# Patient Record
Sex: Female | Born: 1955 | Race: Black or African American | Hispanic: No | Marital: Married | Smoking: Never smoker
Health system: Southern US, Community
[De-identification: ages and names within clinical notes are randomized; demographics above are authoritative.]

---

## 2018-02-24 ENCOUNTER — Observation Stay (HOSPITAL_BASED_OUTPATIENT_CLINIC_OR_DEPARTMENT_OTHER)
Admission: EM | Admit: 2018-02-24 | Discharge: 2018-02-25 | Disposition: A | Discharge status: Home / Self Care | Payer: Federal, State, Local not specified - PPO | Attending: Internal Medicine | Admitting: Internal Medicine

## 2018-02-24 ENCOUNTER — Emergency Department (HOSPITAL_BASED_OUTPATIENT_CLINIC_OR_DEPARTMENT_OTHER): Payer: Federal, State, Local not specified - PPO

## 2018-02-24 ENCOUNTER — Other Ambulatory Visit: Payer: Self-pay

## 2018-02-24 ENCOUNTER — Encounter (HOSPITAL_BASED_OUTPATIENT_CLINIC_OR_DEPARTMENT_OTHER): Payer: Self-pay | Admitting: Emergency Medicine

## 2018-02-24 DIAGNOSIS — R55 Syncope and collapse: Secondary | ICD-10-CM | POA: Diagnosis not present

## 2018-02-24 DIAGNOSIS — Y92002 Bathroom of unspecified non-institutional (private) residence single-family (private) house as the place of occurrence of the external cause: Secondary | ICD-10-CM | POA: Diagnosis not present

## 2018-02-24 DIAGNOSIS — D869 Sarcoidosis, unspecified: Secondary | ICD-10-CM | POA: Diagnosis not present

## 2018-02-24 DIAGNOSIS — Z7952 Long term (current) use of systemic steroids: Secondary | ICD-10-CM | POA: Diagnosis not present

## 2018-02-24 DIAGNOSIS — W1830XA Fall on same level, unspecified, initial encounter: Secondary | ICD-10-CM | POA: Insufficient documentation

## 2018-02-24 DIAGNOSIS — Z885 Allergy status to narcotic agent status: Secondary | ICD-10-CM | POA: Diagnosis not present

## 2018-02-24 DIAGNOSIS — R42 Dizziness and giddiness: Secondary | ICD-10-CM | POA: Diagnosis not present

## 2018-02-24 DIAGNOSIS — I951 Orthostatic hypotension: Secondary | ICD-10-CM | POA: Diagnosis not present

## 2018-02-24 DIAGNOSIS — S2231XA Fracture of one rib, right side, initial encounter for closed fracture: Secondary | ICD-10-CM | POA: Insufficient documentation

## 2018-02-24 DIAGNOSIS — Z79899 Other long term (current) drug therapy: Secondary | ICD-10-CM | POA: Insufficient documentation

## 2018-02-24 DIAGNOSIS — I700 Atherosclerosis of aorta: Secondary | ICD-10-CM | POA: Insufficient documentation

## 2018-02-24 LAB — URINALYSIS, ROUTINE W REFLEX MICROSCOPIC
Bilirubin Urine: NEGATIVE
Glucose, UA: NEGATIVE mg/dL
HGB URINE DIPSTICK: NEGATIVE
Ketones, ur: 15 mg/dL — AB
Nitrite: NEGATIVE
PH: 6 (ref 5.0–8.0)
Protein, ur: NEGATIVE mg/dL
SPECIFIC GRAVITY, URINE: 1.025 (ref 1.005–1.030)

## 2018-02-24 LAB — CBC WITH DIFFERENTIAL/PLATELET
BASOS ABS: 0 10*3/uL (ref 0.0–0.1)
Basophils Relative: 0 %
EOS ABS: 0.2 10*3/uL (ref 0.0–0.7)
Eosinophils Relative: 2 %
HCT: 38.7 % (ref 36.0–46.0)
HEMOGLOBIN: 12.7 g/dL (ref 12.0–15.0)
LYMPHS ABS: 1.7 10*3/uL (ref 0.7–4.0)
Lymphocytes Relative: 20 %
MCH: 30.1 pg (ref 26.0–34.0)
MCHC: 32.8 g/dL (ref 30.0–36.0)
MCV: 91.7 fL (ref 78.0–100.0)
Monocytes Absolute: 1.2 10*3/uL — ABNORMAL HIGH (ref 0.1–1.0)
Monocytes Relative: 14 %
NEUTROS PCT: 64 %
Neutro Abs: 5.4 10*3/uL (ref 1.7–7.7)
Platelets: 140 10*3/uL — ABNORMAL LOW (ref 150–400)
RBC: 4.22 MIL/uL (ref 3.87–5.11)
RDW: 13.3 % (ref 11.5–15.5)
WBC: 8.5 10*3/uL (ref 4.0–10.5)

## 2018-02-24 LAB — COMPREHENSIVE METABOLIC PANEL
ALT: 24 U/L (ref 14–54)
AST: 30 U/L (ref 15–41)
Albumin: 4.3 g/dL (ref 3.5–5.0)
Alkaline Phosphatase: 87 U/L (ref 38–126)
Anion gap: 8 (ref 5–15)
BUN: 16 mg/dL (ref 6–20)
CALCIUM: 9.3 mg/dL (ref 8.9–10.3)
CO2: 25 mmol/L (ref 22–32)
CREATININE: 1.02 mg/dL — AB (ref 0.44–1.00)
Chloride: 108 mmol/L (ref 101–111)
GFR calc non Af Amer: 58 mL/min — ABNORMAL LOW (ref >60)
Glucose, Bld: 90 mg/dL (ref 65–99)
Potassium: 4.4 mmol/L (ref 3.5–5.1)
SODIUM: 141 mmol/L (ref 135–145)
Total Bilirubin: 0.8 mg/dL (ref 0.3–1.2)
Total Protein: 7.3 g/dL (ref 6.5–8.1)

## 2018-02-24 LAB — URINALYSIS, MICROSCOPIC (REFLEX)

## 2018-02-24 LAB — LIPASE, BLOOD: Lipase: 34 U/L (ref 11–51)

## 2018-02-24 LAB — TROPONIN I
Troponin I: <0.03 ng/mL (ref <0.03)
Troponin I: <0.03 ng/mL (ref <0.03)

## 2018-02-24 MED ORDER — IOPAMIDOL (ISOVUE-370) INJECTION 76%
100.0000 mL | Freq: Once | INTRAVENOUS | Status: AC | PRN
  Administered 2018-02-24: 100 mL via INTRAVENOUS

## 2018-02-24 MED ORDER — SODIUM CHLORIDE 0.9 % IV SOLN
INTRAVENOUS | Status: AC
  Administered 2018-02-24: 22:00:00 via INTRAVENOUS

## 2018-02-24 MED ORDER — ONDANSETRON HCL 4 MG/2ML IJ SOLN
4.0000 mg | Freq: Once | INTRAMUSCULAR | Status: AC
  Administered 2018-02-24: 4 mg via INTRAVENOUS
  Filled 2018-02-24: qty 2

## 2018-02-24 MED ORDER — HYDROCODONE-ACETAMINOPHEN 5-325 MG PO TABS
1.0000 | ORAL_TABLET | Freq: Once | ORAL | Status: AC
  Administered 2018-02-24: 1 via ORAL
  Filled 2018-02-24: qty 1

## 2018-02-24 MED ORDER — PREDNISONE 1 MG PO TABS
3.0000 mg | ORAL_TABLET | Freq: Every day | ORAL | Status: DC
Start: 2018-02-25 — End: 2018-02-25
  Filled 2018-02-24: qty 3

## 2018-02-24 MED ORDER — HYDROCODONE-ACETAMINOPHEN 5-325 MG PO TABS
1.0000 | ORAL_TABLET | ORAL | Status: DC | PRN
  Administered 2018-02-24 – 2018-02-25 (×2): 1 via ORAL
  Filled 2018-02-24 (×3): qty 1

## 2018-02-24 MED ORDER — POLYETHYLENE GLYCOL 3350 17 G PO PACK: 17.0000 g | PACK | Freq: Every day | ORAL | Status: DC | PRN

## 2018-02-24 MED ORDER — ONDANSETRON HCL 4 MG PO TABS: 4.0000 mg | ORAL_TABLET | Freq: Four times a day (QID) | ORAL | Status: DC | PRN

## 2018-02-24 MED ORDER — ENOXAPARIN SODIUM 40 MG/0.4ML ~~LOC~~ SOLN
40.0000 mg | SUBCUTANEOUS | Status: DC
  Administered 2018-02-24: 40 mg via SUBCUTANEOUS
  Filled 2018-02-24: qty 0.4

## 2018-02-24 MED ORDER — ONDANSETRON HCL 4 MG/2ML IJ SOLN: 4.0000 mg | Freq: Four times a day (QID) | INTRAMUSCULAR | Status: DC | PRN

## 2018-02-24 MED ORDER — SODIUM CHLORIDE 0.9 % IV BOLUS (SEPSIS)
1000.0000 mL | Freq: Once | INTRAVENOUS | Status: AC
  Administered 2018-02-24: 1000 mL via INTRAVENOUS

## 2018-02-24 MED ORDER — ZOLPIDEM TARTRATE 5 MG PO TABS: 5.0000 mg | ORAL_TABLET | Freq: Every evening | ORAL | Status: DC | PRN

## 2018-02-24 MED ORDER — ZOLPIDEM TARTRATE 10 MG PO TABS
10.0000 mg | ORAL_TABLET | Freq: Every evening | ORAL | Status: DC | PRN
  Filled 2018-02-24: qty 1

## 2018-02-24 MED ORDER — MORPHINE SULFATE (PF) 4 MG/ML IV SOLN
4.0000 mg | Freq: Once | INTRAVENOUS | Status: AC
  Administered 2018-02-24: 4 mg via INTRAVENOUS
  Filled 2018-02-24: qty 1

## 2018-02-24 NOTE — ED Triage Notes (Signed)
Pt fell yesterday in the bathroom yesterday while in the tub.  Pt did have loc.  Pt c/o pain to right rib area.  Pt states she is a little sore on her tailbone and her left jaw.  Pt had fallen earlier in the day and injured her tailbone, thus the hot bath.

## 2018-02-24 NOTE — ED Notes (Signed)
[facility] arrived to transport pt to [facility].  
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
## 2018-02-24 NOTE — ED Notes (Signed)
ED Provider at bedside. 

## 2018-02-25 ENCOUNTER — Observation Stay (HOSPITAL_BASED_OUTPATIENT_CLINIC_OR_DEPARTMENT_OTHER): Payer: Federal, State, Local not specified - PPO

## 2018-02-25 DIAGNOSIS — S2231XA Fracture of one rib, right side, initial encounter for closed fracture: Secondary | ICD-10-CM | POA: Diagnosis not present

## 2018-02-25 DIAGNOSIS — R55 Syncope and collapse: Secondary | ICD-10-CM

## 2018-02-25 DIAGNOSIS — D869 Sarcoidosis, unspecified: Secondary | ICD-10-CM | POA: Diagnosis not present

## 2018-02-25 DIAGNOSIS — I361 Nonrheumatic tricuspid (valve) insufficiency: Secondary | ICD-10-CM

## 2018-02-25 LAB — ECHOCARDIOGRAM COMPLETE
HEIGHTINCHES: 66.5 in
Weight: 2320 oz

## 2018-02-25 LAB — TROPONIN I: Troponin I: <0.03 ng/mL (ref <0.03)

## 2018-02-25 LAB — HIV ANTIBODY (ROUTINE TESTING W REFLEX): HIV SCREEN 4TH GENERATION: NONREACTIVE

## 2018-02-25 MED ORDER — IBUPROFEN 400 MG PO TABS: 400.0000 mg | ORAL_TABLET | Freq: Four times a day (QID) | ORAL | 2 refills | Status: AC | PRN

## 2018-02-25 MED ORDER — DICLOFENAC SODIUM 1 % TD GEL
2.0000 g | Freq: Four times a day (QID) | TRANSDERMAL | Status: DC
  Administered 2018-02-25: 2 g via TOPICAL
  Filled 2018-02-25: qty 100

## 2018-02-25 MED ORDER — ACETAMINOPHEN 500 MG PO TABS: 500.0000 mg | ORAL_TABLET | Freq: Four times a day (QID) | ORAL | 0 refills | Status: AC | PRN

## 2018-02-25 MED ORDER — RANITIDINE HCL 150 MG PO TABS: 150.0000 mg | ORAL_TABLET | Freq: Every day | ORAL | 0 refills | Status: AC

## 2018-02-25 MED ORDER — DICLOFENAC SODIUM 1 % TD GEL: 2.0000 g | Freq: Four times a day (QID) | TRANSDERMAL | 0 refills | Status: AC

## 2018-02-25 NOTE — Progress Notes (Signed)
  Echocardiogram 2D Echocardiogram has been performed.  Leta JunglingCooper, Rambo Sarafian M 02/25/2018, 11:09 AM

## 2018-02-25 NOTE — Progress Notes (Signed)
Carotid artery duplex has been completed. 1-39% ICA stenosis bilaterally.  02/25/18 10:04 AM Alexandra Fields RVT

## 2018-02-25 NOTE — Discharge Summary (Signed)
Physician Discharge Summary  Alexandra Fields ZOX:[id number] DOB: 03-06-1956 DOA: 02/24/2018  PCP: Cheron Schaumann., MD  Admit date: 02/24/2018 Discharge date: 02/25/2018  Admitted From: Home Disposition:  Home  Recommendations for Outpatient Follow-up and new medication changes:  1. Follow up with PCP in 1-week 2. Patient was advised to avoid extreme temperatures  Home Health: no  Equipment/Devices: n0   Discharge Condition: Stable CODE STATUS: full  Diet recommendation: Regular  Brief/Interim Summary: 62 year old female who presented after a syncope episode.  Patient does have a significant past medical history of sarcoidosis, on prednisone.  She presented after a syncope episode. She sustained a fall 2/25, no loss of consciousness but trauma to her right chest wall, the same day at a later time she was sitting in the bathtub with warm water, she felt lightheaded and dizzy and fell again while trying to get out of the bathtub with apparently few seconds of loss of consciousness, 2/26 while having a hot shower she felt dizzy, and had a second episode of syncope for a few seconds.  On the initial physical examination blood pressure 153/89, heart rate 60, respiratory rate 16, oxygen saturation 100%.  Moist mucous membranes, lungs clear to auscultation bilaterally, heart S1-S2 present rhythmic, the abdomen was soft nontender, abdomen non-distended, no lower extremity edema, neurologically patient was no nonfocal.  Sodium 141, potassium 4.4, chloride 1 8, bicarb 25, glucose 90, BUN 16, creatinine 1.02, troponin less than 0.03, white count 8.5, hemoglobin 12.7, hematocrit 38.7, platelets 140.  Urinalysis 0-5 white cells, specific gravity was 1.025, negative protein.  CT of the head and neck negative for acute changes.  CT chest with acute nondisplaced fracture of the right posterior 10th rib, no pulmonary embolism, no acute changes in the abdomen or pelvis, vascular necrosis of bilateral femoral  heads without articular surface collapse.  EKG was normal sinus rhythm, normal axis and normal intervals.  Patient was admitted to the hospital working diagnosis of vasovagal syncope, located by right 10th rib fracture.  1.  Vasovagal syncope, neurocardiogenic syncope.  Patient was admitted to the medical ward, she was placed on remote telemetry monitor, no further syncope episodes.  She received IV fluids.  Her orthostatics were positive for blood pressure, but negative for heart rate.  Lying 162/82, heart rate 82, standing after 3 minutes 141/87, heart rate 81.  Patient was advised to avoid extreme temperatures, change positions cautiously, avoid abrupt standing while laying or sitting.  Further workup with echocardiography and carotid ultrasonography.  Echocardiogram result pending, ultrasound of the carotids with no significant stenosis.   2.  Sarcoidosis.  Patient will continue prednisone therapy as per her usual home regimen.   3.  Right 10th rib posterior rib fracture.  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   Discharge Diagnoses:  Principal Problem:   Syncope Active Problems:   Sarcoidosis    Discharge Instructions   Allergies as of 02/25/2018      Reactions   Codeine Nausea Only      Medication List    TAKE these medications   acetaminophen 500 MG tablet Commonly known as:  TYLENOL Take 1 tablet (500 mg total) by mouth every 6 (six) hours as needed for moderate pain.   diclofenac sodium 1 % Gel Commonly known as:  VOLTAREN Apply 2 g topically 4 (four) times daily. Please apply to tender point on the right posterior chest  wall.   ibuprofen 400 MG tablet Commonly known as:  MOTRIN IB Take 1 tablet (400 mg total) by mouth every 6 (six) hours as needed (severe chest pain.).   predniSONE 1 MG tablet Commonly known as:  DELTASONE Take 3 mg by mouth  every evening.   ranitidine 150 MG tablet Commonly known as:  ZANTAC Take 1 tablet (150 mg total) by mouth at bedtime.   zolpidem 10 MG tablet Commonly known as:  AMBIEN Take 10 mg by mouth at bedtime as needed for sleep.       Allergies  Allergen Reactions  . Codeine Nausea Only    Consultations:     Procedures/Studies: Ct Head Wo Contrast  Result Date: 02/24/2018 CLINICAL DATA:  Pain following fall. Transient loss of consciousness at time of fall. History of sarcoidosis EXAM: CT HEAD WITHOUT CONTRAST CT CERVICAL SPINE WITHOUT CONTRAST TECHNIQUE: Multidetector CT imaging of the head and cervical spine was performed following the standard protocol without intravenous contrast. Multiplanar CT image reconstructions of the cervical spine were also generated. COMPARISON:  Report of prior CT head November 20, 2007 available. Images from that study cannot be retrieved. FINDINGS: CT HEAD FINDINGS Brain: The ventricles are normal in size and configuration. There is no intracranial mass, hemorrhage, extra-axial fluid collection, or midline shift. Gray-white compartments appear normal. No evident acute infarct. Vascular: No hyperdense vessels. There are foci of calcification in each carotid siphon. Skull: The bony calvarium appears intact. Sinuses/Orbits: There is a retention cyst occupying much of the right maxillary antrum. There is mucosal thickening in several ethmoid air cells. Frontal sinuses are aplastic. Orbits appear symmetric bilaterally. Other: Mastoid air cells are clear. CT CERVICAL SPINE FINDINGS Alignment: There is no evidence spondylolisthesis. Skull base and vertebrae: Skull base and craniocervical junction regions appear normal. No evident fracture. No blastic or lytic bone lesions. Soft tissues and spinal canal: Prevertebral soft tissues and predental space regions are normal. There are no paraspinous lesions. No cord or canal hematoma evident. Disc levels: There are cystic changes  along the inferior aspects of the C2 and C4 vertebral body which may represent Schmorl's type nodes. There is mild disc space narrowing at C3-4 and C7-T1. There is no appreciable nerve root edema or effacement. No disc extrusion or stenosis. Note that there is facet hypertrophy, mild, at several levels. Upper chest: Visualized upper lung zones are clear. Other: There is calcification in each carotid artery, slightly more on the left than on the right. IMPRESSION: CT head: No intracranial mass or hemorrhage. No extra-axial fluid collection. The gray-white compartments appear normal. Mild vascular calcification noted. There are foci of paranasal sinus disease. CT cervical spine: No fracture or spondylolisthesis. There are areas of arthropathic change. There is calcification in each carotid artery. Electronically Signed   By: Bretta Bang III M.D.   On: 02/24/2018 12:11   Ct Angio Chest Pe W And/or Wo Contrast  Result Date: 02/24/2018 CLINICAL DATA:  Pt fell yesterday and today, pt states she did lose consciousness yesterday for roughly 1 min, right rib pain, sore to jaw and tailbone, HTN, hyperlipidemia, sarcoidosis, granuloma of liver, history of subdural hemorrhage in 2008 EXAM: CT ANGIOGRAPHY CHEST CT ABDOMEN AND PELVIS WITH CONTRAST Multidetector CT imaging of the chest was performed using the standard protocol during bolus administration of intravenous contrast. Multiplanar CT image reconstructions and MIPs were obtained to evaluate the vascular anatomy. Multidetector CT imaging of the abdomen and pelvis was performed using the standard protocol during bolus administration  of intravenous contrast. CONTRAST:  100mL ISOVUE-370 IOPAMIDOL (ISOVUE-370) INJECTION 76% COMPARISON:  None. FINDINGS: CTA CHEST FINDINGS Cardiovascular: Satisfactory opacification of the pulmonary arteries to the segmental level. No evidence of pulmonary embolism. Normal heart size. No pericardial effusion. Thoracic aorta is normal in  caliber. Thoracic aortic atherosclerosis. Mediastinum/Nodes: No enlarged mediastinal, hilar, or axillary lymph nodes. Thyroid gland, trachea, and esophagus demonstrate no significant findings. Lungs/Pleura: Mild bilateral centrilobular emphysema. Mild right middle lobe and lingular atelectasis. No focal consolidation. No pleural effusion or pneumothorax. Musculoskeletal: Acute nondisplaced fracture of the right posterior tenth rib. No aggressive osseous lesion. Degenerative disc disease with disc height loss of the midthoracic spine. Subchondral sclerosis in the left humeral head as can be seen with avascular necrosis. Review of the MIP images confirms the above findings. CT ABDOMEN and PELVIS FINDINGS Hepatobiliary: No focal liver abnormality is seen. No gallstones, gallbladder wall thickening, or biliary dilatation. Pancreas: Unremarkable. No pancreatic ductal dilatation or surrounding inflammatory changes. Spleen: Normal in size without focal abnormality. Adrenals/Urinary Tract: Adrenal glands are unremarkable. Kidneys are normal, without renal calculi, focal lesion, or hydronephrosis. Bladder is unremarkable. Stomach/Bowel: Stomach is within normal limits. Appendix appears normal. No evidence of bowel wall thickening, distention, or inflammatory changes. Vascular/Lymphatic: Normal caliber abdominal aorta. Abdominal aortic atherosclerosis. No lymphadenopathy. Reproductive: Uterus and bilateral adnexa are unremarkable. Other: No abdominal wall hernia or abnormality. No abdominopelvic ascites. Musculoskeletal: Serpiginous subchondral sclerosis in bilateral femoral heads consistent with avascular necrosis without articular surface collapse. Review of the MIP images confirms the above findings. IMPRESSION: 1. Acute nondisplaced fracture of the right posterior tenth rib. 2. Otherwise, no acute injury of the chest, abdomen or pelvis. 3. No evidence pulmonary embolus. 4.  Aortic Atherosclerosis (ICD10-I70.0). 5.   Emphysema (ICD10-J43.9). 6. 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 Electronically Signed   By: Elige KoHetal  Patel   On: 02/24/2018 12:17   Ct Cervical Spine Wo Contrast  Result Date: 02/24/2018 CLINICAL DATA:  Pain following fall. Transient loss of consciousness at time of fall. History of sarcoidosis EXAM: CT HEAD WITHOUT CONTRAST CT CERVICAL SPINE WITHOUT CONTRAST TECHNIQUE: Multidetector CT imaging of the head and cervical spine was performed following the standard protocol without intravenous contrast. Multiplanar CT image reconstructions of the cervical spine were also generated. COMPARISON:  Report of prior CT head November 20, 2007 available. Images from that study cannot be retrieved. FINDINGS: CT HEAD FINDINGS Brain: The ventricles are normal in size and configuration. There is no intracranial mass, hemorrhage, extra-axial fluid collection, or midline shift. Gray-white compartments appear normal. No evident acute infarct. Vascular: No hyperdense vessels. There are foci of calcification in each carotid siphon. Skull: The bony calvarium appears intact. Sinuses/Orbits: There is a retention cyst occupying much of the right maxillary antrum. There is mucosal thickening in several ethmoid air cells. Frontal sinuses are aplastic. Orbits appear symmetric bilaterally. Other: Mastoid air cells are clear. CT CERVICAL SPINE FINDINGS Alignment: There is no evidence spondylolisthesis. Skull base and vertebrae: Skull base and craniocervical junction regions appear normal. No evident fracture. No blastic or lytic bone lesions. Soft tissues and spinal canal: Prevertebral soft tissues and predental space regions are normal. There are no paraspinous lesions. No cord or canal hematoma evident. Disc levels: There are cystic changes along the inferior aspects of the C2 and C4 vertebral body which may represent Schmorl's type nodes. There is mild disc space narrowing at C3-4 and C7-T1.  There is no appreciable nerve root edema or effacement. No disc extrusion  or stenosis. Note that there is facet hypertrophy, mild, at several levels. Upper chest: Visualized upper lung zones are clear. Other: There is calcification in each carotid artery, slightly more on the left than on the right. IMPRESSION: CT head: No intracranial mass or hemorrhage. No extra-axial fluid collection. The gray-white compartments appear normal. Mild vascular calcification noted. There are foci of paranasal sinus disease. CT cervical spine: No fracture or spondylolisthesis. There are areas of arthropathic change. There is calcification in each carotid artery. Electronically Signed   By: Bretta Bang III M.D.   On: 02/24/2018 12:11   Ct Abdomen Pelvis W Contrast  Result Date: 02/24/2018 CLINICAL DATA:  Pt fell yesterday and today, pt states she did lose consciousness yesterday for roughly 1 min, right rib pain, sore to jaw and tailbone, HTN, hyperlipidemia, sarcoidosis, granuloma of liver, history of subdural hemorrhage in 2008 EXAM: CT ANGIOGRAPHY CHEST CT ABDOMEN AND PELVIS WITH CONTRAST Multidetector CT imaging of the chest was performed using the standard protocol during bolus administration of intravenous contrast. Multiplanar CT image reconstructions and MIPs were obtained to evaluate the vascular anatomy. Multidetector CT imaging of the abdomen and pelvis was performed using the standard protocol during bolus administration of intravenous contrast. CONTRAST:  ISOVUE-370 IOPAMIDOL (ISOVUE-370) INJECTION 76% COMPARISON:  None. FINDINGS: CTA CHEST FINDINGS Cardiovascular: Satisfactory opacification of the pulmonary arteries to the segmental level. No evidence of pulmonary embolism. Normal heart size. No pericardial effusion. Thoracic aorta is normal in caliber. Thoracic aortic atherosclerosis. Mediastinum/Nodes: No enlarged mediastinal, hilar, or axillary lymph nodes. Thyroid gland, trachea, and esophagus  demonstrate no significant findings. Lungs/Pleura: Mild bilateral centrilobular emphysema. Mild right middle lobe and lingular atelectasis. No focal consolidation. No pleural effusion or pneumothorax. Musculoskeletal: Acute nondisplaced fracture of the right posterior tenth rib. No aggressive osseous lesion. Degenerative disc disease with disc height loss of the midthoracic spine. Subchondral sclerosis in the left humeral head as can be seen with avascular necrosis. Review of the MIP images confirms the above findings. CT ABDOMEN and PELVIS FINDINGS Hepatobiliary: No focal liver abnormality is seen. No gallstones, gallbladder wall thickening, or biliary dilatation. Pancreas: Unremarkable. No pancreatic ductal dilatation or surrounding inflammatory changes. Spleen: Normal in size without focal abnormality. Adrenals/Urinary Tract: Adrenal glands are unremarkable. Kidneys are normal, without renal calculi, focal lesion, or hydronephrosis. Bladder is unremarkable. Stomach/Bowel: Stomach is within normal limits. Appendix appears normal. No evidence of bowel wall thickening, distention, or inflammatory changes. Vascular/Lymphatic: Normal caliber abdominal aorta. Abdominal aortic atherosclerosis. No lymphadenopathy. Reproductive: Uterus and bilateral adnexa are unremarkable. Other: No abdominal wall hernia or abnormality. No abdominopelvic ascites. Musculoskeletal: Serpiginous subchondral sclerosis in bilateral femoral heads consistent with avascular necrosis without articular surface collapse. Review of the MIP images confirms the above findings. IMPRESSION: 1. Acute nondisplaced fracture of the right posterior tenth rib. 2. Otherwise, no acute injury of the chest, abdomen or pelvis. 3. No evidence pulmonary embolus. 4.  Aortic Atherosclerosis (ICD10-I70.0). 5.  Emphysema (ICD10-J43.9). 6. 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 Electronically Signed   By: Elige Ko   On:  02/24/2018 12:17       Subjective: Patient is feeling better, pain is well controlled, no dizziness, lightheadedness or further syncopal episodes.  Patient has been ambulating  Discharge Exam: Vitals:   02/25/18 0438 02/25/18 1306  BP: 139/79 137/77  Pulse: (!) 58 (!) 59  Resp: 15 16  Temp: 98.7 F (37.1 C) 98.9 F (37.2 C)  SpO2: 99% 99%   Vitals:  02/24/18 1900 02/24/18 2052 02/25/18 0438 02/25/18 1306  BP: (!) 141/77 (!) 147/88 139/79 137/77  Pulse: 61 (!) 59 (!) 58 (!) 59  Resp: 16 16 15 16   Temp:  98.4 F (36.9 C) 98.7 F (37.1 C) 98.9 F (37.2 C)  TempSrc:  Oral Oral Oral  SpO2: 97% 100% 99% 99%  Weight:      Height:        General: Not in pain or dyspnea  Neurology: Awake and alert, non focal  E ENT: mild pallor, no icterus, oral mucosa moist Cardiovascular: No JVD. S1-S2 present, rhythmic, no gallops, rubs, or murmurs. No lower extremity edema. Pulmonary: vesicular breath sounds bilaterally, adequate air movement, no wheezing, rhonchi or rales. Gastrointestinal. Abdomen flat, no organomegaly, non tender, no rebound or guarding Skin. No rashes Musculoskeletal: no joint deformities   The results of significant diagnostics from this hospitalization (including imaging, microbiology, ancillary and laboratory) are listed below for reference.     Microbiology: No results found for this or any previous visit (from the past 240 hour(s)).   Labs: BNP (last 3 results) No results for input(s): BNP in the last 8760 hours. Basic Metabolic Panel: Recent Labs  Lab 02/24/18 1035  NA 141  K 4.4  CL 108  CO2 25  GLUCOSE 90  BUN 16  CREATININE 1.02*  CALCIUM 9.3   Liver Function Tests: Recent Labs  Lab 02/24/18 1035  AST 30  ALT 24  ALKPHOS 87  BILITOT 0.8  PROT 7.3  ALBUMIN 4.3   Recent Labs  Lab 02/24/18 1035  LIPASE 34   No results for input(s): AMMONIA in the last 168 hours. CBC: Recent Labs  Lab 02/24/18 1035  WBC 8.5  NEUTROABS 5.4   HGB 12.7  HCT 38.7  MCV 91.7  PLT 140*   Cardiac Enzymes: Recent Labs  Lab 02/24/18 1035 02/24/18 2136 02/25/18 0324 02/25/18 0908  TROPONINI <0.03 <0.03 <0.03 <0.03   BNP: Invalid input(s): POCBNP CBG: No results for input(s): GLUCAP in the last 168 hours. D-Dimer No results for input(s): DDIMER in the last 72 hours. Hgb A1c No results for input(s): HGBA1C in the last 72 hours. Lipid Profile No results for input(s): CHOL, HDL, LDLCALC, TRIG, CHOLHDL, LDLDIRECT in the last 72 hours. Thyroid function studies No results for input(s): TSH, T4TOTAL, T3FREE, THYROIDAB in the last 72 hours.  Invalid input(s): FREET3 Anemia work up No results for input(s): VITAMINB12, FOLATE, FERRITIN, TIBC, IRON, RETICCTPCT in the last 72 hours. Urinalysis    Component Value Date/Time   COLORURINE YELLOW 02/24/2018 1130   APPEARANCEUR CLEAR 02/24/2018 1130   LABSPEC 1.025 02/24/2018 1130   PHURINE 6.0 02/24/2018 1130   GLUCOSEU NEGATIVE 02/24/2018 1130   HGBUR NEGATIVE 02/24/2018 1130   BILIRUBINUR NEGATIVE 02/24/2018 1130   KETONESUR 15 (A) 02/24/2018 1130   PROTEINUR NEGATIVE 02/24/2018 1130   NITRITE NEGATIVE 02/24/2018 1130   LEUKOCYTESUR SMALL (A) 02/24/2018 1130   Sepsis Labs Invalid input(s): PROCALCITONIN,  WBC,  LACTICIDVEN Microbiology No results found for this or any previous visit (from the past 240 hour(s)).   Time coordinating discharge: 45 minutes  SIGNED:   Coralie Keens, MD  [facility] 02/25/2018, 2:04 PM Pager [phone]  If 7PM-7AM, please contact night-coverage [email or web address] Password [email or web address]

## 2018-02-25 NOTE — Progress Notes (Signed)
Per patient's request moved prednisone administration time to 1800.

## 2018-02-25 NOTE — Evaluation (Signed)
Physical Therapy Evaluation-1x Patient Details Name: Alexandra Fields MRN: [id number] DOB: 12-Oct-1956 Today's Date: 02/25/2018   History of Present Illness  62 yo female admitted after experiencing syncope + collapse x 3 at home. During one fall, pt sustained R rib fracture. Hx of avascular necrosis bil hips, SDH, sarcoidosis    Clinical Impression  On eval, pt was Mod Ind with mobility. She walked ~200 feet around the unit. No LOB during session. Pt denied lightheadedness/dizziness. No PT needs. 1x eval. Will sign off.     Follow Up Recommendations No PT follow up;Supervision for mobility/OOB(for a few days for safety due to recent spells)    Equipment Recommendations  None recommended by PT    Recommendations for Other Services       Precautions / Restrictions Precautions Precautions: Fall Restrictions Weight Bearing Restrictions: No      Mobility  Bed Mobility Overal bed mobility: Modified Independent             General bed mobility comments: increased time. Pt did rely on bedrail  Transfers Overall transfer level: Modified independent               General transfer comment: Increased time. Pt denied dizziness/lightheadedness.   Ambulation/Gait Ambulation/Gait assistance: Modified independent (Device/Increase time) Ambulation Distance (Feet): 200 Feet Assistive device: None Gait Pattern/deviations: Decreased stride length     General Gait Details: slow but steady gait. Pt guarding/bracing R side of trunk due to rib fx. No LOB. Pt denied dizziness/lightheadedness.   Stairs            Wheelchair Mobility    Modified Rankin (Stroke Patients Only)       Balance Overall balance assessment: No apparent balance deficits (not formally assessed)                                           Pertinent Vitals/Pain Pain Assessment: Faces Faces Pain Scale: Hurts even more Pain Location: R side of trunk with movement Pain Descriptors /  Indicators: Shooting Pain Intervention(s): Limited activity within patient's tolerance    Home Living Family/patient expects to be discharged to:: Private residence Living Arrangements: Spouse/significant other Available Help at Discharge: Family Type of Home: House Home Access: Stairs to enter     Home Layout: Multi-level Home Equipment: None      Prior Function Level of Independence: Independent               Hand Dominance        Extremity/Trunk Assessment   Upper Extremity Assessment Upper Extremity Assessment: Overall WFL for tasks assessed    Lower Extremity Assessment Lower Extremity Assessment: Overall WFL for tasks assessed    Cervical / Trunk Assessment Cervical / Trunk Assessment: Normal  Communication   Communication: No difficulties  Cognition Arousal/Alertness: Awake/alert Behavior During Therapy: WFL for tasks assessed/performed Overall Cognitive Status: Within Functional Limits for tasks assessed                                        General Comments      Exercises     Assessment/Plan    PT Assessment Patent does not need any further PT services  PT Problem List         PT Treatment Interventions  PT Goals (Current goals can be found in the Care Plan section)  Acute Rehab PT Goals Patient Stated Goal: home PT Goal Formulation: All assessment and education complete, DC therapy    Frequency     Barriers to discharge        Co-evaluation               AM-PAC PT "6 Clicks" Daily Activity  Outcome Measure Difficulty turning over in bed (including adjusting bedclothes, sheets and blankets)?: A Little Difficulty moving from lying on back to sitting on the side of the bed? : A Little Difficulty sitting down on and standing up from a chair with arms (e.g., wheelchair, bedside commode, etc,.)?: A Little Help needed moving to and from a bed to chair (including a wheelchair)?: None Help needed walking in  hospital room?: None Help needed climbing 3-5 steps with a railing? : None 6 Click Score: 21    End of Session Equipment Utilized During Treatment: Gait belt Activity Tolerance: Patient tolerated treatment well Patient left: in bed;with call bell/phone within reach;with family/visitor present        Time: [id number] PT Time Calculation (min) (ACUTE ONLY): 8 min   Charges:   PT Evaluation $PT Eval Low Complexity: 1 Low     PT G Codes:        Rebeca AlertJannie Mattson Dayal, MPT Pager: [phone]

## 2018-02-25 NOTE — Progress Notes (Signed)
Discharge instructions and medications discussed with patient.  AVS given to patient.  All questions answered.  

## 2019-07-22 IMAGING — CT CT ABD-PELV W/ CM
3 of 14 series · 12 of 46 positions shown, 17 images · non-contrast
Comparison: None.

CLINICAL DATA: Pt fell yesterday and today, pt states she did lose
consciousness yesterday for roughly 1 min, right rib pain, sore to
jaw and tailbone, HTN, hyperlipidemia, sarcoidosis, granuloma of
liver, history of subdural hemorrhage in 7885

[Series 6: pe thins · axial · 0.63mm/px · z∈[-363,-90]mm · 8 of 316 slices shown]
[im 22/316  soft-tissue]
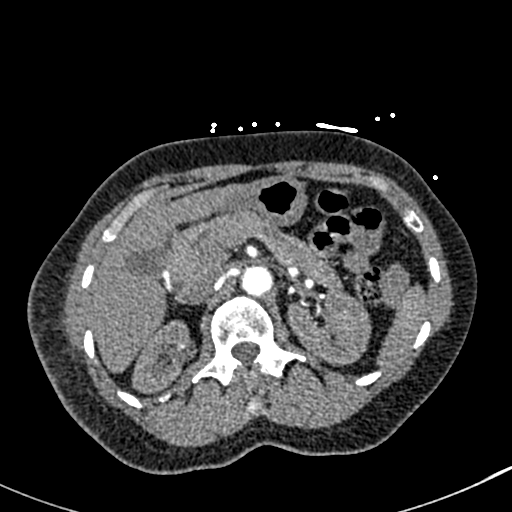
[im 64/316  soft-tissue]
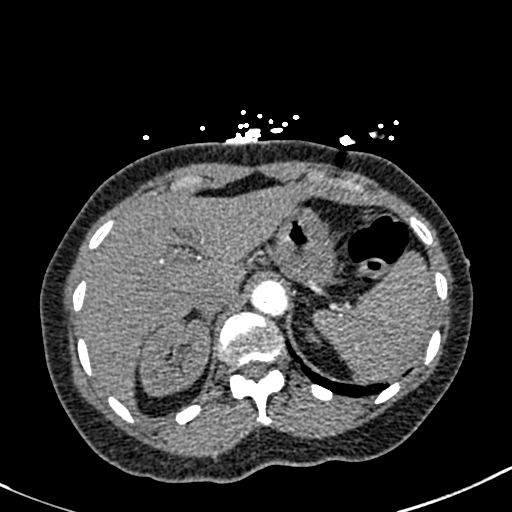
[im 106/316  soft-tissue]
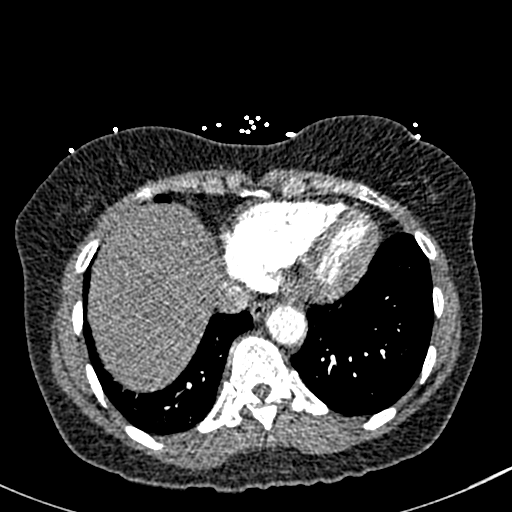
[im 148/316  soft-tissue]
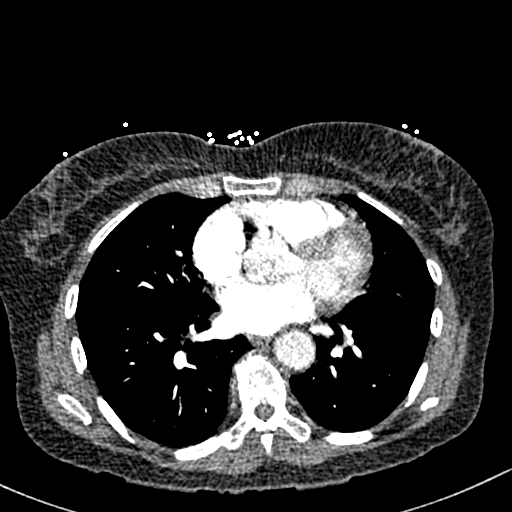
[im 169/316  soft-tissue]
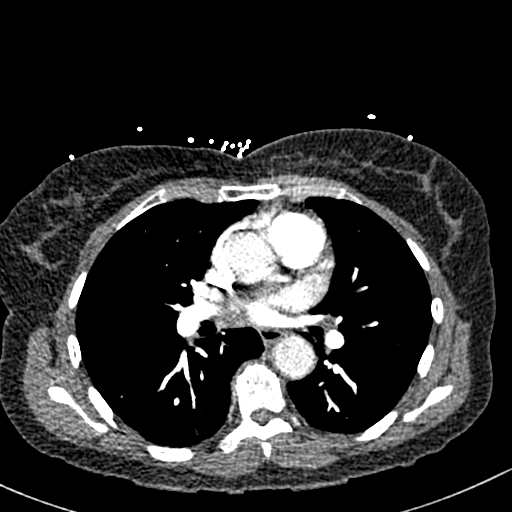
[im 211/316  soft-tissue]
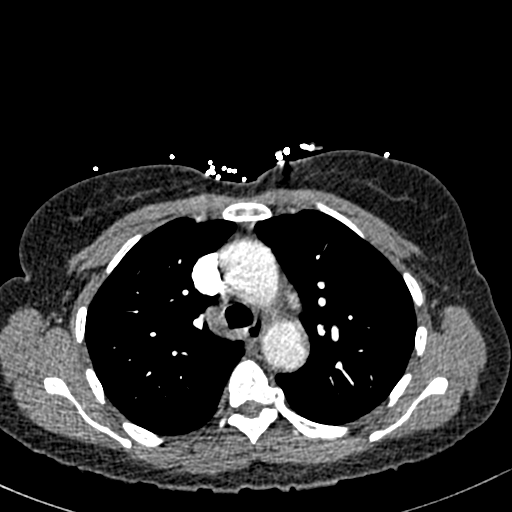
[im 253/316  soft-tissue]
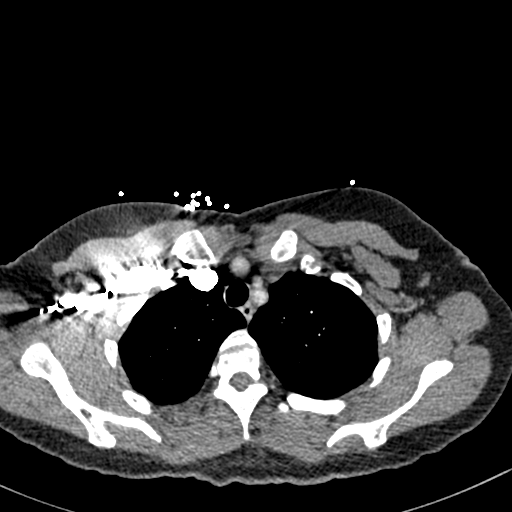
[im 295/316  soft-tissue]
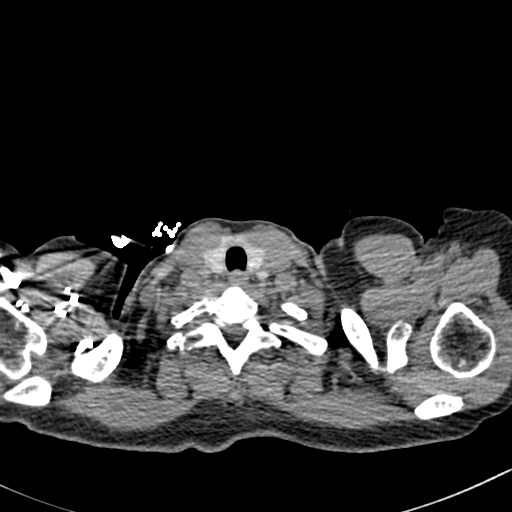

[Series 7: pe coronal mpr · coronal · 0.57mm/px · 1 of 110 slices shown, 2 images]
[im 55/110  soft-tissue]
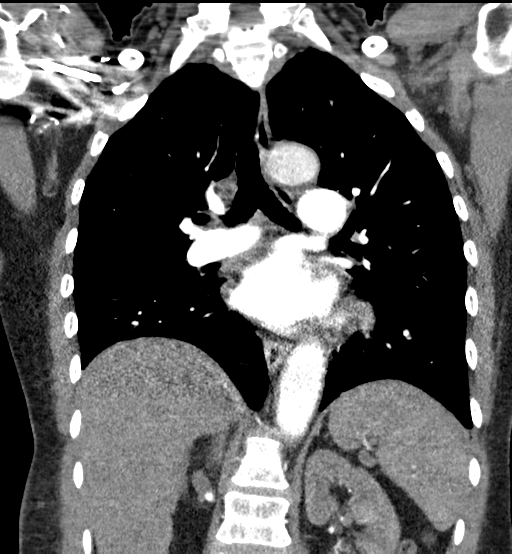
[im 55/110  bone]
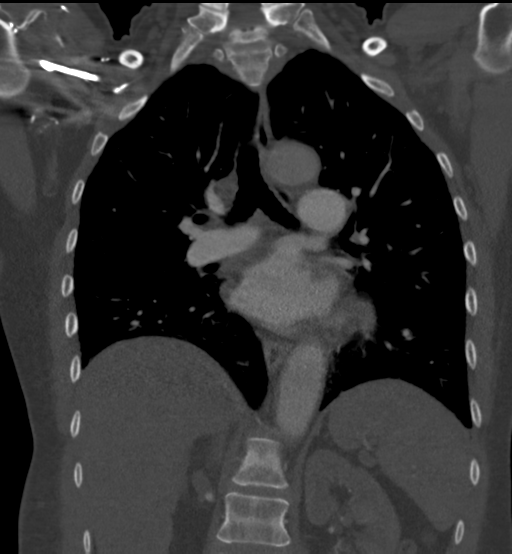

[Series 12: axial st · axial · 0.91mm/px · z∈[-642,-392]mm · 3 of 101 slices shown, 7 images]
[im 26/101  soft-tissue]
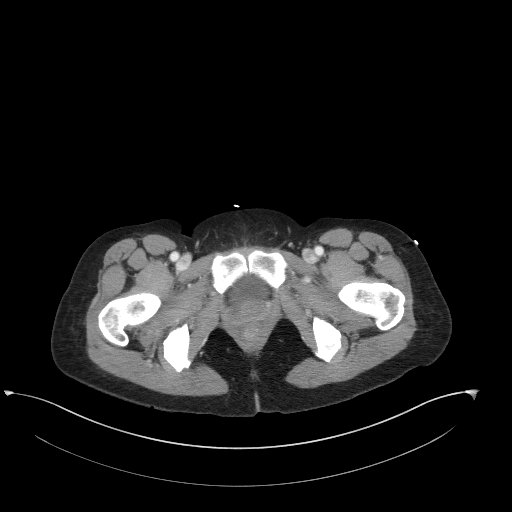
[im 26/101  lung]
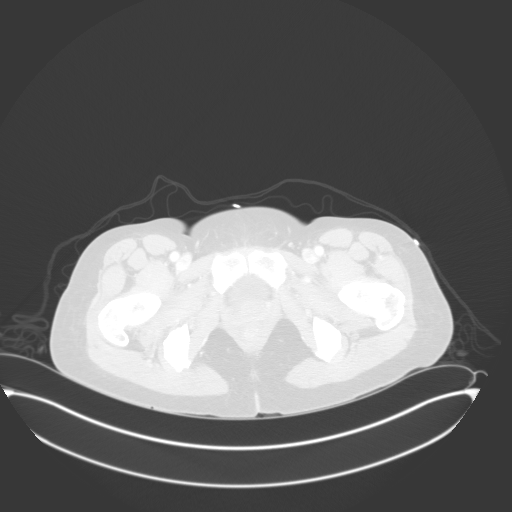
[im 26/101  bone]
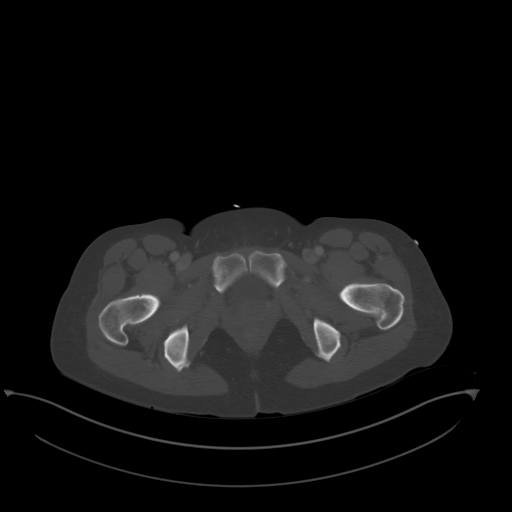
[im 51/101  soft-tissue]
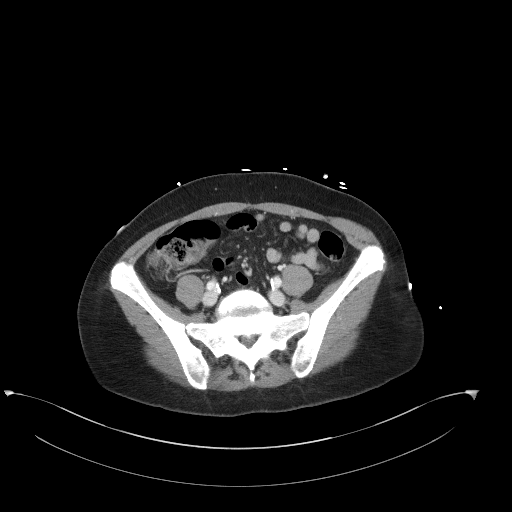
[im 51/101  lung]
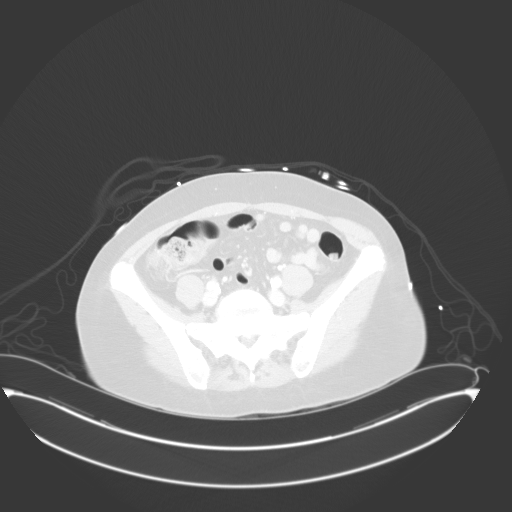
[im 76/101  soft-tissue]
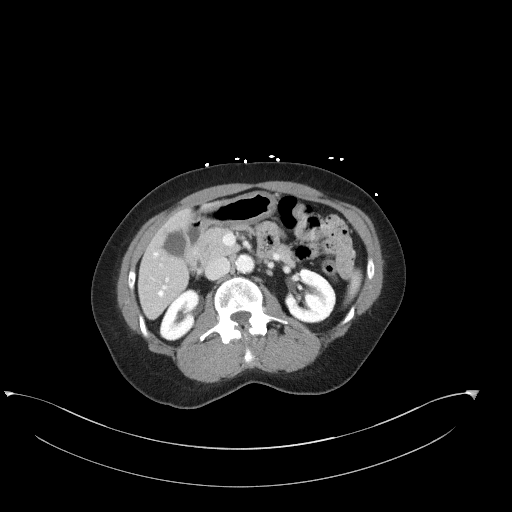
[im 76/101  lung]
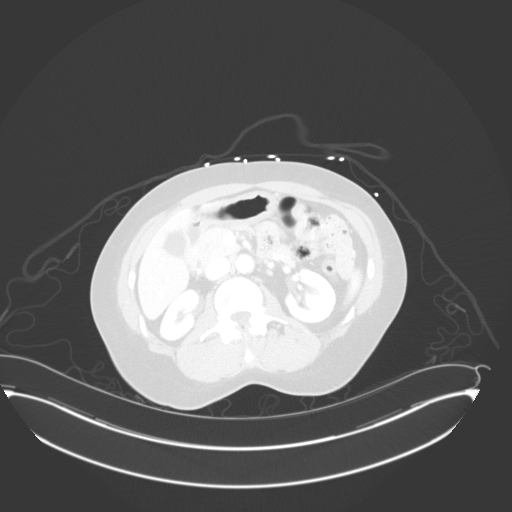

[12 of 46 positions shown; findings below may reference images not displayed]

EXAM:
CT ANGIOGRAPHY CHEST

CT ABDOMEN AND PELVIS WITH CONTRAST

Multidetector CT imaging of the chest was performed using the
standard protocol during bolus administration of intravenous
contrast. Multiplanar CT image reconstructions and MIPs were
obtained to evaluate the vascular anatomy. Multidetector CT imaging
of the abdomen and pelvis was performed using the standard protocol
during bolus administration of intravenous contrast.

CONTRAST:  100mL [id number] IOPAMIDOL ([id number]) INJECTION 76%
FINDINGS: CTA CHEST FINDINGS

Cardiovascular: Satisfactory opacification of the pulmonary arteries
to the segmental level. No evidence of pulmonary embolism. Normal
heart size. No pericardial effusion. Thoracic aorta is normal in
caliber. Thoracic aortic atherosclerosis.

Mediastinum/Nodes: No enlarged mediastinal, hilar, or axillary lymph
nodes. Thyroid gland, trachea, and esophagus demonstrate no
significant findings.

Lungs/Pleura: Mild bilateral centrilobular emphysema. Mild right
middle lobe and lingular atelectasis. No focal consolidation. No
pleural effusion or pneumothorax.

Musculoskeletal: Acute nondisplaced fracture of the right posterior
tenth rib. No aggressive osseous lesion. Degenerative disc disease
with disc height loss of the midthoracic spine. Subchondral
sclerosis in the left humeral head as can be seen with avascular
necrosis.

Review of the MIP images confirms the above findings.

CT ABDOMEN and PELVIS FINDINGS

Hepatobiliary: No focal liver abnormality is seen. No gallstones,
gallbladder wall thickening, or biliary dilatation.

Pancreas: Unremarkable. No pancreatic ductal dilatation or
surrounding inflammatory changes.

Spleen: Normal in size without focal abnormality.

Adrenals/Urinary Tract: Adrenal glands are unremarkable. Kidneys are
normal, without renal calculi, focal lesion, or hydronephrosis.
Bladder is unremarkable.

Stomach/Bowel: Stomach is within normal limits. Appendix appears
normal. No evidence of bowel wall thickening, distention, or
inflammatory changes.

Vascular/Lymphatic: Normal caliber abdominal aorta. Abdominal aortic
atherosclerosis. No lymphadenopathy.

Reproductive: Uterus and bilateral adnexa are unremarkable.

Other: No abdominal wall hernia or abnormality. No abdominopelvic
ascites.

Musculoskeletal: Serpiginous subchondral sclerosis in bilateral
femoral heads consistent with avascular necrosis without articular
surface collapse.

Review of the MIP images confirms the above findings.
IMPRESSION: 1. Acute nondisplaced fracture of the right posterior tenth rib.
2. Otherwise, no acute injury of the chest, abdomen or pelvis.
3. No evidence pulmonary embolus.
4.  Aortic Atherosclerosis (R8GB9-RD4.4).
5.  Emphysema (R8GB9-0DC.S).
6. Avascular necrosis of bilateral femoral heads without articular
surface collapse.

## 2021-01-02 ENCOUNTER — Other Ambulatory Visit (HOSPITAL_BASED_OUTPATIENT_CLINIC_OR_DEPARTMENT_OTHER): Payer: Self-pay | Admitting: Internal Medicine

## 2021-01-02 ENCOUNTER — Ambulatory Visit: Payer: Federal, State, Local not specified - PPO | Attending: Internal Medicine

## 2021-01-02 DIAGNOSIS — Z23 Encounter for immunization: Secondary | ICD-10-CM

## 2021-01-02 MED FILL — FLUAD QUADRIVALENT 0.5 ML P: 0.5 | 1 days supply | Qty: 1 | Fill #0

## 2021-01-02 NOTE — Progress Notes (Signed)
   Covid-19 Vaccination Clinic  Name:  Alexandra Fields    MRN: [id number] DOB: Dec 27, 1956  01/02/2021  Alexandra Fields was observed post Covid-19 immunization for 15 minutes without incident. She was provided with Vaccine Information Sheet and instruction to access the V-Safe system.  Vaccinated by Fredirick Maudlin  Alexandra Fields was instructed to call 911 with any severe reactions post vaccine: Marland Kitchen Difficulty breathing  . Swelling of face and throat  . A fast heartbeat  . A bad rash all over body  . Dizziness and weakness   Immunizations Administered    Name Date Dose VIS Date Route   Pfizer COVID-19 Vaccine 01/02/2021  9:58 AM 0.3 mL 10/18/2020 Intramuscular   Manufacturer: [employer], [employer]   Lot: [id number]   NDC: [address]-7

## 2021-01-03 MED FILL — PFIZER-BIONTECH COVID-19 VA: 30 | 21 days supply | Qty: 0 | Fill #0

## 2021-08-01 ENCOUNTER — Ambulatory Visit: Payer: Federal, State, Local not specified - PPO | Attending: Internal Medicine

## 2021-08-01 DIAGNOSIS — Z23 Encounter for immunization: Secondary | ICD-10-CM

## 2021-08-01 NOTE — Progress Notes (Signed)
   Covid-19 Vaccination Clinic  Name:  Zoeya Gramajo    MRN: [id number] DOB: 04/09/1956  08/01/2021  Ms. Hestand was observed post Covid-19 immunization for 15 minutes without incident. She was provided with Vaccine Information Sheet and instruction to access the V-Safe system.   Ms. Lukasik was instructed to call 911 with any severe reactions post vaccine: Difficulty breathing  Swelling of face and throat  A fast heartbeat  A bad rash all over body  Dizziness and weakness   Immunizations Administered     Name Date Dose VIS Date Route   PFIZER Comrnaty(Gray TOP) Covid-19 Vaccine 08/01/2021 11:44 AM 0.3 mL 12/07/2020 Intramuscular   Manufacturer: [employer], [employer]   Lot: [id number]   NDC: [phone]

## 2021-08-02 ENCOUNTER — Other Ambulatory Visit (HOSPITAL_BASED_OUTPATIENT_CLINIC_OR_DEPARTMENT_OTHER): Payer: Self-pay

## 2021-08-02 MED ORDER — COVID-19 MRNA VAC-TRIS(PFIZER) 30 MCG/0.3ML IM SUSP
INTRAMUSCULAR | 0 refills | Status: AC
  Filled 2021-08-02: qty 0.3, 1d supply, fill #0

## 2022-04-09 ENCOUNTER — Ambulatory Visit: Payer: Federal, State, Local not specified - PPO | Attending: Internal Medicine

## 2022-04-09 DIAGNOSIS — Z23 Encounter for immunization: Secondary | ICD-10-CM

## 2022-04-09 NOTE — Progress Notes (Signed)
? ?  Covid-19 Vaccination Clinic ? ?Name:  Alexandra Fields    ?MRN: [id number] ?DOB: [birth year] ? ?04/09/2022 ? ?Ms. Weisheit was observed post Covid-19 immunization for 15 minutes without incident. She was provided with Vaccine Information Sheet and instruction to access the V-Safe system.  ? ?Ms. Bondar was instructed to call 911 with any severe reactions post vaccine: ?Difficulty breathing  ?Swelling of face and throat  ?A fast heartbeat  ?A bad rash all over body  ?Dizziness and weakness  ? ?Immunizations Administered   ? ? Name Date Dose VIS Date Route  ? Art gallery manager Booster 04/09/2022 12:08 PM 0.3 mL 08/29/2021 Intramuscular  ? Manufacturer: [employer]  ? Lot: [id number]  ? NDC: [phone]  ? ?  ? ? ?

## 2022-04-11 ENCOUNTER — Other Ambulatory Visit (HOSPITAL_BASED_OUTPATIENT_CLINIC_OR_DEPARTMENT_OTHER): Payer: Self-pay

## 2022-04-11 MED ORDER — PFIZER COVID-19 VAC BIVALENT 30 MCG/0.3ML IM SUSP
INTRAMUSCULAR | 0 refills | Status: AC
  Filled 2022-04-11: qty 0.3, 1d supply, fill #0

## 2022-10-08 ENCOUNTER — Other Ambulatory Visit (HOSPITAL_BASED_OUTPATIENT_CLINIC_OR_DEPARTMENT_OTHER): Payer: Self-pay

## 2022-10-08 MED ORDER — FLUAD QUADRIVALENT 0.5 ML IM PRSY
PREFILLED_SYRINGE | INTRAMUSCULAR | 0 refills | Status: AC
  Filled 2022-10-08: qty 0.5, 1d supply, fill #0

## 2022-12-31 ENCOUNTER — Other Ambulatory Visit (HOSPITAL_BASED_OUTPATIENT_CLINIC_OR_DEPARTMENT_OTHER): Payer: Self-pay

## 2022-12-31 MED ORDER — COMIRNATY 30 MCG/0.3ML IM SUSY
PREFILLED_SYRINGE | INTRAMUSCULAR | 0 refills | Status: AC
  Filled 2022-12-31: qty 0.3, 1d supply, fill #0

## 2023-11-06 ENCOUNTER — Other Ambulatory Visit (HOSPITAL_BASED_OUTPATIENT_CLINIC_OR_DEPARTMENT_OTHER): Payer: Self-pay

## 2023-11-06 MED ORDER — INFLUENZA VAC A&B SURF ANT ADJ 0.5 ML IM SUSY
0.5000 mL | PREFILLED_SYRINGE | Freq: Once | INTRAMUSCULAR | 0 refills | Status: AC
  Filled 2023-11-06: qty 0.5, 1d supply, fill #0

## 2024-01-12 ENCOUNTER — Other Ambulatory Visit (HOSPITAL_BASED_OUTPATIENT_CLINIC_OR_DEPARTMENT_OTHER): Payer: Self-pay

## 2024-01-12 MED ORDER — COMIRNATY 30 MCG/0.3ML IM SUSY
0.3000 mL | PREFILLED_SYRINGE | Freq: Once | INTRAMUSCULAR | 0 refills | Status: AC
  Filled 2024-01-12: qty 0.3, 1d supply, fill #0

## 2024-10-12 ENCOUNTER — Other Ambulatory Visit (HOSPITAL_BASED_OUTPATIENT_CLINIC_OR_DEPARTMENT_OTHER): Payer: Self-pay

## 2024-10-12 MED ORDER — FLUZONE HIGH-DOSE 0.5 ML IM SUSY
0.5000 mL | PREFILLED_SYRINGE | Freq: Once | INTRAMUSCULAR | 0 refills | Status: AC
  Filled 2024-10-12: qty 0.5, 1d supply, fill #0

## 2024-10-12 MED ORDER — COMIRNATY 30 MCG/0.3ML IM SUSY
0.3000 mL | PREFILLED_SYRINGE | Freq: Once | INTRAMUSCULAR | 0 refills | Status: AC
  Filled 2024-10-12: qty 0.3, 1d supply, fill #0
